# Patient Record
Sex: Male | Born: 2000 | Race: White | Hispanic: No | Marital: Single | Smoking: Never smoker
Health system: Southern US, Community
[De-identification: ages and names within clinical notes are randomized; demographics above are authoritative.]

## PROBLEM LIST (undated history)

## (undated) DIAGNOSIS — R1110 Vomiting, unspecified: Secondary | ICD-10-CM

## (undated) DIAGNOSIS — T7840XA Allergy, unspecified, initial encounter: Secondary | ICD-10-CM

## (undated) DIAGNOSIS — K219 Gastro-esophageal reflux disease without esophagitis: Secondary | ICD-10-CM

## (undated) DIAGNOSIS — F419 Anxiety disorder, unspecified: Secondary | ICD-10-CM

## (undated) DIAGNOSIS — F909 Attention-deficit hyperactivity disorder, unspecified type: Secondary | ICD-10-CM

## (undated) HISTORY — DX: Vomiting, unspecified: R11.10

## (undated) HISTORY — PX: TYMPANOSTOMY TUBE PLACEMENT: SHX32

## (undated) HISTORY — PX: ADENOIDECTOMY: SUR15

## (undated) HISTORY — DX: Anxiety disorder, unspecified: F41.9

## (undated) HISTORY — DX: Allergy, unspecified, initial encounter: T78.40XA

## (undated) HISTORY — PX: WISDOM TOOTH EXTRACTION: SHX21

## (undated) HISTORY — DX: Attention-deficit hyperactivity disorder, unspecified type: F90.9

## (undated) HISTORY — DX: Gastro-esophageal reflux disease without esophagitis: K21.9

---
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
## 2021-02-10 ENCOUNTER — Other Ambulatory Visit: Payer: Self-pay

## 2021-02-10 ENCOUNTER — Telehealth: Payer: Self-pay | Admitting: Family Medicine

## 2021-02-10 NOTE — Telephone Encounter (Signed)
Refill request sent to provider.

## 2021-02-10 NOTE — Telephone Encounter (Signed)
Pt's dad called in asking for a refill on [facility]trana, [facility] [address] [facility] [place]   Pt would like a 90 day supply sent in.  Pt can be reached at the home #

## 2021-02-10 NOTE — Telephone Encounter (Signed)
Last refill: 12/05/20 #90 , 0 Last OV: 12/05/20 dx. GERD, ADHD, anxiety

## 2021-02-11 MED ORDER — DAYTRANA 20 MG/9HR TD PTCH: MEDICATED_PATCH | TRANSDERMAL | 0 refills | Status: DC

## 2021-02-11 NOTE — Telephone Encounter (Signed)
Controlled substance database (PDMP) reviewed. No concerns appreciated.  Last filled 01/06/21. Appears refill ordered today by Janeece Agee, NP.

## 2021-02-23 ENCOUNTER — Other Ambulatory Visit (HOSPITAL_COMMUNITY): Payer: Self-pay | Admitting: Psychiatry

## 2021-02-28 ENCOUNTER — Other Ambulatory Visit (HOSPITAL_COMMUNITY): Payer: Self-pay | Admitting: Psychiatry

## 2021-06-09 ENCOUNTER — Ambulatory Visit: Payer: Medicaid Other | Admitting: Family Medicine

## 2021-06-25 ENCOUNTER — Ambulatory Visit: Payer: Medicaid Other | Admitting: Family Medicine

## 2021-07-08 ENCOUNTER — Ambulatory Visit: Payer: 59 | Admitting: Family Medicine

## 2021-07-08 ENCOUNTER — Encounter: Payer: Self-pay | Admitting: Family Medicine

## 2021-07-08 VITALS — BP 134/100 | HR 86 | Temp 99.3°F | Resp 16 | Wt 235.2 lb

## 2021-07-08 DIAGNOSIS — F901 Attention-deficit hyperactivity disorder, predominantly hyperactive type: Secondary | ICD-10-CM | POA: Diagnosis not present

## 2021-07-08 DIAGNOSIS — E669 Obesity, unspecified: Secondary | ICD-10-CM

## 2021-07-08 DIAGNOSIS — F419 Anxiety disorder, unspecified: Secondary | ICD-10-CM

## 2021-07-08 DIAGNOSIS — R030 Elevated blood-pressure reading, without diagnosis of hypertension: Secondary | ICD-10-CM | POA: Diagnosis not present

## 2021-07-08 LAB — CBC WITH DIFFERENTIAL/PLATELET
Basophils Absolute: 0.1 10*3/uL (ref 0.0–0.1)
Basophils Relative: 0.8 % (ref 0.0–3.0)
Eosinophils Absolute: 0.4 10*3/uL (ref 0.0–0.7)
Eosinophils Relative: 5.2 % — ABNORMAL HIGH (ref 0.0–5.0)
HCT: 45 % (ref 36.0–49.0)
Hemoglobin: 14.7 g/dL (ref 12.0–16.0)
Lymphocytes Relative: 30.3 % (ref 24.0–48.0)
Lymphs Abs: 2.1 10*3/uL (ref 0.7–4.0)
MCHC: 32.8 g/dL (ref 31.0–37.0)
MCV: 81.9 fl (ref 78.0–98.0)
Monocytes Absolute: 0.8 10*3/uL (ref 0.1–1.0)
Monocytes Relative: 11.6 % (ref 3.0–12.0)
Neutro Abs: 3.7 10*3/uL (ref 1.4–7.7)
Neutrophils Relative %: 52.1 % (ref 43.0–71.0)
Platelets: 302 10*3/uL (ref 150.0–575.0)
RBC: 5.49 Mil/uL (ref 3.80–5.70)
RDW: 13.7 % (ref 11.4–15.5)
WBC: 7.1 10*3/uL (ref 4.5–13.5)

## 2021-07-08 LAB — BASIC METABOLIC PANEL
BUN: 14 mg/dL (ref 6–23)
CO2: 25 mEq/L (ref 19–32)
Calcium: 9.4 mg/dL (ref 8.4–10.5)
Chloride: 105 mEq/L (ref 96–112)
Creatinine, Ser: 0.87 mg/dL (ref 0.40–1.50)
GFR: 125.34 mL/min (ref >60.00)
Glucose, Bld: 81 mg/dL (ref 70–99)
Potassium: 3.9 mEq/L (ref 3.5–5.1)
Sodium: 140 mEq/L (ref 135–145)

## 2021-07-08 LAB — LIPID PANEL
Cholesterol: 149 mg/dL (ref 0–200)
HDL: 47.7 mg/dL (ref >39.00)
LDL Cholesterol: 79 mg/dL (ref 0–99)
NonHDL: 101.55
Total CHOL/HDL Ratio: 3
Triglycerides: 111 mg/dL (ref 0.0–149.0)
VLDL: 22.2 mg/dL (ref 0.0–40.0)

## 2021-07-08 LAB — HEPATIC FUNCTION PANEL
ALT: 24 U/L (ref 0–53)
AST: 17 U/L (ref 0–37)
Albumin: 4.8 g/dL (ref 3.5–5.2)
Alkaline Phosphatase: 72 U/L (ref 52–171)
Bilirubin, Direct: 0.1 mg/dL (ref 0.0–0.3)
Total Bilirubin: 0.6 mg/dL (ref 0.2–1.2)
Total Protein: 7.5 g/dL (ref 6.0–8.3)

## 2021-07-08 LAB — TSH: TSH: 1.81 u[IU]/mL (ref 0.40–5.00)

## 2021-07-08 NOTE — Assessment & Plan Note (Signed)
Pt feels sxs are well controlled on Sertraline.  Is able to talk himself down from panic attacks and feels that this is serving him well.  Well continue to follow.

## 2021-07-08 NOTE — Progress Notes (Signed)
° °  Subjective:    Patient ID: Shane Fitzgerald, male    DOB: 31-Dec-2000, 21 y.o.   MRN: [id number]  HPI Obesity- pt is down 11 lbs since last visit.  Not eating breakfast, rarely eats throughout the day, and then eats dinner.  Drink plenty of water through the day.  Does a lot of heavy lifting at work and physical activity.  Pt admits to rare dizziness- and when this occurs pt will eat.  Pt is doing this as intermittent fasting.  ADHD- ongoing issue for pt.  Currently on Guanfacine 3mg  daily.  Pt feels ADHD is well controlled even after stopping Daytrana.  Pt reports he is able to control his hyperactivity.  Anxiety- ongoing issue for pt.  Currently on Sertraline 100mg  daily.  Pt reports sxs are well controlled.  Is able to talk himself out of panic situations and can stop himself from going down the rabbit hole.  Elevated BP- BP at last visit was 116/80.  Today is 160/100.  Pt reports he was anxious to come here alone for the first time.    Review of Systems For ROS see HPI   This visit occurred during the SARS-CoV-2 public health emergency.  Safety protocols were in place, including screening questions prior to the visit, additional usage of staff PPE, and extensive cleaning of exam room while observing appropriate contact time as indicated for disinfecting solutions.       Objective:   Physical Exam Vitals reviewed.  Constitutional:      General: He is not in acute distress.    Appearance: Normal appearance. He is well-developed. He is obese. He is not ill-appearing.  HENT:     Head: Normocephalic and atraumatic.  Eyes:     Extraocular Movements: Extraocular movements intact.     Conjunctiva/sclera: Conjunctivae normal.     Pupils: Pupils are equal, round, and reactive to light.  Neck:     Thyroid: No thyromegaly.  Cardiovascular:     Rate and Rhythm: Normal rate and regular rhythm.     Pulses: Normal pulses.     Heart sounds: Normal heart sounds. No murmur heard. Pulmonary:      Effort: Pulmonary effort is normal. No respiratory distress.     Breath sounds: Normal breath sounds.  Abdominal:     General: Bowel sounds are normal. There is no distension.     Palpations: Abdomen is soft.  Musculoskeletal:     Cervical back: Normal range of motion and neck supple.     Right lower leg: No edema.     Left lower leg: No edema.  Lymphadenopathy:     Cervical: No cervical adenopathy.  Skin:    General: Skin is warm and dry.  Neurological:     General: No focal deficit present.     Mental Status: He is alert and oriented to person, place, and time.     Cranial Nerves: No cranial nerve deficit.  Psychiatric:        Mood and Affect: Mood normal.        Behavior: Behavior normal.          Assessment & Plan:  Elevated BP- pt has no hx of elevated BP and at last visit BP was normal.  Pt admits to being very nervous for his appt today.  Will have him return in a few weeks to recheck BP and determine if medications are needed.

## 2021-07-08 NOTE — Assessment & Plan Note (Signed)
Pt is down 11 lbs since last visit.  He states he is doing his own version of intermittent fasting but I discussed my concerns regarding this (not enough calories, storing the calories he does eat as fat due to starvation mode, hypoglycemia).  Pt states he is able to recognize the sxs of hypoglycemia and will eat when needed.  Encouraged healthy diet and regular exercise.  Will get labs to risk stratify.  Will follow.

## 2021-07-08 NOTE — Patient Instructions (Signed)
Schedule your complete physical in 6 months We'll notify you of your lab results and make any changes if needed Keep up the good work on healthy diet and regular exercise- you're doing great!!! Call with any questions or concerns Stay Safe!  Stay Healthy! Happy New Year!! 

## 2021-07-08 NOTE — Assessment & Plan Note (Signed)
Ongoing issue for pt.  He feels that sxs are well managed on his Intuniv.  Was able to stop the Daytrana w/o difficulty.  Will continue to follow.

## 2021-07-09 ENCOUNTER — Telehealth: Payer: Self-pay

## 2021-07-09 NOTE — Telephone Encounter (Signed)
Patient aware of labs.  

## 2021-07-09 NOTE — Telephone Encounter (Signed)
-----   Message from Sheliah Hatch, MD sent at 07/08/2021  7:52 PM EST ----- Labs look great!  No changes at this time

## 2021-08-12 ENCOUNTER — Encounter: Payer: Self-pay | Admitting: Family Medicine

## 2021-08-12 ENCOUNTER — Ambulatory Visit (INDEPENDENT_AMBULATORY_CARE_PROVIDER_SITE_OTHER): Payer: 59 | Admitting: Family Medicine

## 2021-08-12 VITALS — BP 124/84 | HR 81 | Temp 99.7°F | Wt 237.4 lb

## 2021-08-12 DIAGNOSIS — R030 Elevated blood-pressure reading, without diagnosis of hypertension: Secondary | ICD-10-CM | POA: Diagnosis not present

## 2021-08-12 NOTE — Progress Notes (Signed)
° °  Subjective:    Patient ID: Shane Fitzgerald, male    DOB: 2000-09-16, 21 y.o.   MRN: [id number]  HPI Elevated BP- at last visit BP was 160/100 and 134/100.  Today is WNL.  Denies CP, SOB, HAs, visual changes, edema.  Pt admits he gets very anxious coming to the doctor.  Noted today that he got very hot when he put the mask on, which made him nervous, which made his HR increase.   Review of Systems For ROS see HPI   This visit occurred during the SARS-CoV-2 public health emergency.  Safety protocols were in place, including screening questions prior to the visit, additional usage of staff PPE, and extensive cleaning of exam room while observing appropriate contact time as indicated for disinfecting solutions.      Objective:   Physical Exam Vitals reviewed.  Constitutional:      General: He is not in acute distress.    Appearance: Normal appearance. He is not ill-appearing.  HENT:     Head: Normocephalic and atraumatic.  Eyes:     Extraocular Movements: Extraocular movements intact.     Conjunctiva/sclera: Conjunctivae normal.     Pupils: Pupils are equal, round, and reactive to light.  Cardiovascular:     Rate and Rhythm: Normal rate and regular rhythm.     Pulses: Normal pulses.     Heart sounds: Normal heart sounds.  Pulmonary:     Effort: Pulmonary effort is normal.     Breath sounds: Normal breath sounds.  Musculoskeletal:     Cervical back: Normal range of motion and neck supple.  Lymphadenopathy:     Cervical: No cervical adenopathy.  Skin:    General: Skin is warm and dry.  Neurological:     General: No focal deficit present.     Mental Status: He is alert and oriented to person, place, and time.  Psychiatric:        Mood and Affect: Mood normal.        Behavior: Behavior normal.        Thought Content: Thought content normal.          Assessment & Plan:

## 2021-08-12 NOTE — Patient Instructions (Signed)
Schedule your complete physical in 6 months BP looks fantastic!!! No changes at this time Call with any questions or concerns Happy Valentine's Day!!!

## 2022-01-06 ENCOUNTER — Encounter: Payer: 59 | Admitting: Family Medicine

## 2022-07-06 ENCOUNTER — Encounter (HOSPITAL_COMMUNITY): Payer: Self-pay | Admitting: Emergency Medicine

## 2022-07-06 ENCOUNTER — Emergency Department (HOSPITAL_COMMUNITY)
Admission: EM | Admit: 2022-07-06 | Discharge: 2022-07-06 | Discharge status: Against Medical Advice | Payer: Self-pay | Attending: Emergency Medicine | Admitting: Emergency Medicine

## 2022-07-06 ENCOUNTER — Other Ambulatory Visit: Payer: Self-pay

## 2022-07-06 DIAGNOSIS — Z5321 Procedure and treatment not carried out due to patient leaving prior to being seen by health care provider: Secondary | ICD-10-CM | POA: Insufficient documentation

## 2022-07-06 DIAGNOSIS — R1031 Right lower quadrant pain: Secondary | ICD-10-CM | POA: Insufficient documentation

## 2022-07-06 LAB — CBC
HCT: 48.8 % (ref 39.0–52.0)
Hemoglobin: 15.6 g/dL (ref 13.0–17.0)
MCH: 27.7 pg (ref 26.0–34.0)
MCHC: 32 g/dL (ref 30.0–36.0)
MCV: 86.7 fL (ref 80.0–100.0)
Platelets: 277 10*3/uL (ref 150–400)
RBC: 5.63 MIL/uL (ref 4.22–5.81)
RDW: 13.2 % (ref 11.5–15.5)
WBC: 8.9 10*3/uL (ref 4.0–10.5)
nRBC: 0 % (ref 0.0–0.2)

## 2022-07-06 LAB — COMPREHENSIVE METABOLIC PANEL
ALT: 19 U/L (ref 0–44)
AST: 18 U/L (ref 15–41)
Albumin: 4.8 g/dL (ref 3.5–5.0)
Alkaline Phosphatase: 55 U/L (ref 38–126)
Anion gap: 8 (ref 5–15)
BUN: 13 mg/dL (ref 6–20)
CO2: 24 mmol/L (ref 22–32)
Calcium: 9.2 mg/dL (ref 8.9–10.3)
Chloride: 104 mmol/L (ref 98–111)
Creatinine, Ser: 0.8 mg/dL (ref 0.61–1.24)
GFR, Estimated: >60 mL/min (ref >60)
Glucose, Bld: 98 mg/dL (ref 70–99)
Potassium: 4 mmol/L (ref 3.5–5.1)
Sodium: 136 mmol/L (ref 135–145)
Total Bilirubin: 0.5 mg/dL (ref 0.3–1.2)
Total Protein: 8.1 g/dL (ref 6.5–8.1)

## 2022-07-06 LAB — LIPASE, BLOOD: Lipase: 38 U/L (ref 11–51)

## 2022-07-06 NOTE — ED Triage Notes (Signed)
Pt c/o rlq pain today and felt like he had to uriante, urinated some but very little. Seen at [place] and sent here due to tenderness to rlq. N/v once today. No pain at this time. Color wnl.

## 2022-12-22 DIAGNOSIS — Z6824 Body mass index (BMI) 24.0-24.9, adult: Secondary | ICD-10-CM | POA: Diagnosis not present

## 2022-12-22 DIAGNOSIS — R112 Nausea with vomiting, unspecified: Secondary | ICD-10-CM | POA: Diagnosis not present

## 2023-03-25 ENCOUNTER — Encounter: Payer: Self-pay | Admitting: Family Medicine

## 2023-03-25 ENCOUNTER — Ambulatory Visit (INDEPENDENT_AMBULATORY_CARE_PROVIDER_SITE_OTHER): Payer: 59 | Admitting: Family Medicine

## 2023-03-25 VITALS — BP 124/70 | HR 86 | Temp 98.8°F | Wt 177.0 lb

## 2023-03-25 DIAGNOSIS — F419 Anxiety disorder, unspecified: Secondary | ICD-10-CM

## 2023-03-25 MED ORDER — SERTRALINE HCL 100 MG PO TABS: 100.0000 mg | ORAL_TABLET | Freq: Every day | ORAL | 1 refills | Status: DC

## 2023-03-25 NOTE — Patient Instructions (Signed)
Follow up in 3 weeks to recheck mood START the Sertraline 50mg  daily Limit your caffeine- this can worsen your anxiety Try and work on stress management, deep breathing, etc Call with any questions or concerns Hang in there!!!

## 2023-03-28 NOTE — Assessment & Plan Note (Signed)
Deteriorated.  Pt doesn't report a specific trigger but has been under multiple stressors.  Encouraged him to talk through his anxiety, work on Optician, dispensing.  Will restart Sertraline as this worked well for him in the past.  He will return in 4-6 weeks to determine if adjustments need to be made.

## 2023-04-15 ENCOUNTER — Ambulatory Visit: Payer: 59 | Admitting: Family Medicine

## 2023-04-18 ENCOUNTER — Other Ambulatory Visit: Payer: Self-pay | Admitting: Family Medicine

## 2023-04-19 ENCOUNTER — Encounter: Payer: Self-pay | Admitting: Family Medicine

## 2023-04-19 ENCOUNTER — Ambulatory Visit: Payer: 59 | Admitting: Family Medicine

## 2023-04-19 VITALS — BP 118/70 | HR 83 | Temp 98.3°F | Ht 71.0 in | Wt 173.4 lb

## 2023-04-19 DIAGNOSIS — F419 Anxiety disorder, unspecified: Secondary | ICD-10-CM | POA: Diagnosis not present

## 2023-04-19 NOTE — Patient Instructions (Signed)
Schedule your complete physical in 6 months No med changes at this time- you're doing great! Call with any questions or concerns Stay Safe!  Stay Healthy! HAPPY BIRTHDAY!!!

## 2023-04-19 NOTE — Assessment & Plan Note (Signed)
Much improved w/ restarting the Sertraline.  No med changes at this time.  Will follow.

## 2023-04-19 NOTE — Progress Notes (Signed)
   Subjective:    Patient ID: Shane Fitzgerald, male    DOB: [birth year], 22 y.o.   MRN: [id number]  HPI Anxiety- at last visit we restarted Sertraline 100mg  daily.  Pt reports feeling much better.  Still able to stress when it's appropriate but not over small things.  Pt reports things feel easier to handle.  'a whole different look on life.  Things are way less scary'.     Review of Systems For ROS see HPI     Objective:   Physical Exam Vitals reviewed.  Constitutional:      General: He is not in acute distress.    Appearance: Normal appearance. He is not ill-appearing.  HENT:     Head: Normocephalic and atraumatic.  Skin:    General: Skin is warm and dry.  Neurological:     General: No focal deficit present.     Mental Status: He is alert and oriented to person, place, and time.  Psychiatric:        Mood and Affect: Mood normal.        Behavior: Behavior normal.        Thought Content: Thought content normal.           Assessment & Plan:

## 2023-10-26 DIAGNOSIS — Z6823 Body mass index (BMI) 23.0-23.9, adult: Secondary | ICD-10-CM | POA: Diagnosis not present

## 2023-10-26 DIAGNOSIS — R112 Nausea with vomiting, unspecified: Secondary | ICD-10-CM | POA: Diagnosis not present

## 2023-10-26 DIAGNOSIS — K5289 Other specified noninfective gastroenteritis and colitis: Secondary | ICD-10-CM | POA: Diagnosis not present

## 2023-10-26 DIAGNOSIS — M25511 Pain in right shoulder: Secondary | ICD-10-CM | POA: Diagnosis not present
